# Patient Record
Sex: Male | Born: 1970 | Race: White | Hispanic: No | Marital: Married | Smoking: Never smoker
Health system: Southern US, Community
[De-identification: ages and names within clinical notes are randomized; demographics above are authoritative.]

## PROBLEM LIST (undated history)

## (undated) DIAGNOSIS — Q2381 Bicuspid aortic valve: Secondary | ICD-10-CM

## (undated) DIAGNOSIS — Z789 Other specified health status: Secondary | ICD-10-CM

## (undated) DIAGNOSIS — Q231 Congenital insufficiency of aortic valve: Secondary | ICD-10-CM

## (undated) HISTORY — PX: VASECTOMY: SHX75

## (undated) HISTORY — PX: WISDOM TOOTH EXTRACTION: SHX21

---

## 2011-10-15 ENCOUNTER — Other Ambulatory Visit: Payer: Self-pay

## 2011-10-15 ENCOUNTER — Ambulatory Visit (HOSPITAL_COMMUNITY): Payer: BC Managed Care – PPO

## 2011-10-15 ENCOUNTER — Encounter (HOSPITAL_COMMUNITY): Payer: Self-pay | Admitting: Certified Registered Nurse Anesthetist

## 2011-10-15 ENCOUNTER — Ambulatory Visit (INDEPENDENT_AMBULATORY_CARE_PROVIDER_SITE_OTHER): Payer: BC Managed Care – PPO | Admitting: Emergency Medicine

## 2011-10-15 ENCOUNTER — Encounter (HOSPITAL_BASED_OUTPATIENT_CLINIC_OR_DEPARTMENT_OTHER): Payer: Self-pay | Admitting: *Deleted

## 2011-10-15 ENCOUNTER — Encounter (HOSPITAL_COMMUNITY)
Admission: EM | Disposition: A | Discharge status: Home / Self Care | Payer: Self-pay | Source: Ambulatory Visit | Attending: Orthopedic Surgery

## 2011-10-15 ENCOUNTER — Ambulatory Visit (HOSPITAL_COMMUNITY)
Admission: EM | Admit: 2011-10-15 | Discharge: 2011-10-15 | Disposition: A | Discharge status: Home / Self Care | Payer: BC Managed Care – PPO | Source: Ambulatory Visit | Attending: Orthopedic Surgery | Admitting: Orthopedic Surgery

## 2011-10-15 ENCOUNTER — Ambulatory Visit: Payer: BC Managed Care – PPO

## 2011-10-15 ENCOUNTER — Encounter (HOSPITAL_COMMUNITY): Payer: Self-pay | Admitting: Pharmacy Technician

## 2011-10-15 ENCOUNTER — Ambulatory Visit (HOSPITAL_COMMUNITY): Payer: BC Managed Care – PPO | Admitting: Certified Registered Nurse Anesthetist

## 2011-10-15 VITALS — BP 116/74 | HR 50 | Temp 98.2°F | Resp 14 | Ht 77.0 in | Wt 260.0 lb

## 2011-10-15 DIAGNOSIS — S6990XA Unspecified injury of unspecified wrist, hand and finger(s), initial encounter: Secondary | ICD-10-CM | POA: Insufficient documentation

## 2011-10-15 DIAGNOSIS — W278XXA Contact with other nonpowered hand tool, initial encounter: Secondary | ICD-10-CM | POA: Insufficient documentation

## 2011-10-15 DIAGNOSIS — S61209A Unspecified open wound of unspecified finger without damage to nail, initial encounter: Secondary | ICD-10-CM

## 2011-10-15 DIAGNOSIS — Z01810 Encounter for preprocedural cardiovascular examination: Secondary | ICD-10-CM | POA: Insufficient documentation

## 2011-10-15 DIAGNOSIS — S60459A Superficial foreign body of unspecified finger, initial encounter: Secondary | ICD-10-CM

## 2011-10-15 DIAGNOSIS — Z01818 Encounter for other preprocedural examination: Secondary | ICD-10-CM | POA: Insufficient documentation

## 2011-10-15 DIAGNOSIS — Z01812 Encounter for preprocedural laboratory examination: Secondary | ICD-10-CM | POA: Insufficient documentation

## 2011-10-15 DIAGNOSIS — S6980XA Other specified injuries of unspecified wrist, hand and finger(s), initial encounter: Secondary | ICD-10-CM | POA: Insufficient documentation

## 2011-10-15 HISTORY — DX: Other specified health status: Z78.9

## 2011-10-15 HISTORY — PX: I & D EXTREMITY: SHX5045

## 2011-10-15 HISTORY — DX: Bicuspid aortic valve: Q23.81

## 2011-10-15 HISTORY — DX: Congenital insufficiency of aortic valve: Q23.1

## 2011-10-15 LAB — BASIC METABOLIC PANEL
CO2: 23 mEq/L (ref 19–32)
Calcium: 9.4 mg/dL (ref 8.4–10.5)
Chloride: 103 mEq/L (ref 96–112)
Glucose, Bld: 80 mg/dL (ref 70–99)
Sodium: 138 mEq/L (ref 135–145)

## 2011-10-15 LAB — GRAM STAIN

## 2011-10-15 LAB — CBC
Hemoglobin: 15.8 g/dL (ref 13.0–17.0)
MCH: 31.3 pg (ref 26.0–34.0)
MCV: 87.3 fL (ref 78.0–100.0)
Platelets: 224 10*3/uL (ref 150–400)
RBC: 5.05 MIL/uL (ref 4.22–5.81)
WBC: 9.4 10*3/uL (ref 4.0–10.5)

## 2011-10-15 LAB — SURGICAL PCR SCREEN: MRSA, PCR: NEGATIVE

## 2011-10-15 SURGERY — IRRIGATION AND DEBRIDEMENT EXTREMITY
Anesthesia: General | Site: Finger | Laterality: Right | Wound class: Dirty or Infected

## 2011-10-15 MED ORDER — FENTANYL CITRATE 0.05 MG/ML IJ SOLN
INTRAMUSCULAR | Status: DC | PRN
  Administered 2011-10-15 (×4): 50 ug via INTRAVENOUS

## 2011-10-15 MED ORDER — DEXTROSE 5 % IV SOLN
INTRAVENOUS | Status: DC | PRN
  Administered 2011-10-15: 20:00:00 via INTRAVENOUS

## 2011-10-15 MED ORDER — LACTATED RINGERS IV SOLN
INTRAVENOUS | Status: DC | PRN
  Administered 2011-10-15 (×2): via INTRAVENOUS

## 2011-10-15 MED ORDER — VANCOMYCIN HCL 1000 MG IV SOLR
1000.0000 mg | INTRAVENOUS | Status: DC | PRN
  Administered 2011-10-15: 1000 mg via INTRAVENOUS

## 2011-10-15 MED ORDER — VANCOMYCIN HCL IN DEXTROSE 1-5 GM/200ML-% IV SOLN
INTRAVENOUS | Status: AC
  Filled 2011-10-15: qty 200

## 2011-10-15 MED ORDER — CHLORHEXIDINE GLUCONATE 4 % EX LIQD: 60.0000 mL | Freq: Once | CUTANEOUS | Status: DC

## 2011-10-15 MED ORDER — LIDOCAINE HCL (CARDIAC) 20 MG/ML IV SOLN
INTRAVENOUS | Status: DC | PRN
  Administered 2011-10-15: 60 mg via INTRAVENOUS

## 2011-10-15 MED ORDER — SULFAMETHOXAZOLE-TRIMETHOPRIM 800-160 MG PO TABS: 1.0000 | ORAL_TABLET | Freq: Two times a day (BID) | ORAL | Status: AC

## 2011-10-15 MED ORDER — BUPIVACAINE HCL (PF) 0.25 % IJ SOLN
INTRAMUSCULAR | Status: DC | PRN
  Administered 2011-10-15: 10 mL

## 2011-10-15 MED ORDER — OXYCODONE-ACETAMINOPHEN 5-325 MG PO TABS: ORAL_TABLET | ORAL | Status: AC

## 2011-10-15 MED ORDER — MIDAZOLAM HCL 5 MG/5ML IJ SOLN
INTRAMUSCULAR | Status: DC | PRN
  Administered 2011-10-15: 2 mg via INTRAVENOUS

## 2011-10-15 MED ORDER — SODIUM CHLORIDE 0.9 % IR SOLN
Status: DC | PRN
  Administered 2011-10-15: 3000 mL

## 2011-10-15 MED ORDER — MUPIROCIN 2 % EX OINT
TOPICAL_OINTMENT | Freq: Two times a day (BID) | CUTANEOUS | Status: DC
  Administered 2011-10-15: 1 via NASAL

## 2011-10-15 MED ORDER — HYDROMORPHONE HCL PF 1 MG/ML IJ SOLN: 0.2500 mg | INTRAMUSCULAR | Status: DC | PRN

## 2011-10-15 MED ORDER — PROPOFOL 10 MG/ML IV EMUL
INTRAVENOUS | Status: DC | PRN
  Administered 2011-10-15: 250 mg via INTRAVENOUS

## 2011-10-15 SURGICAL SUPPLY — 58 items
BANDAGE COBAN STERILE 2 (GAUZE/BANDAGES/DRESSINGS) IMPLANT
BANDAGE CONFORM 2  STR LF (GAUZE/BANDAGES/DRESSINGS) IMPLANT
BANDAGE ELASTIC 3 VELCRO ST LF (GAUZE/BANDAGES/DRESSINGS) ×2 IMPLANT
BANDAGE ELASTIC 4 VELCRO ST LF (GAUZE/BANDAGES/DRESSINGS) ×2 IMPLANT
BANDAGE GAUZE ELAST BULKY 4 IN (GAUZE/BANDAGES/DRESSINGS) ×2 IMPLANT
BNDG COHESIVE 1X5 TAN STRL LF (GAUZE/BANDAGES/DRESSINGS) IMPLANT
BNDG ESMARK 4X9 LF (GAUZE/BANDAGES/DRESSINGS) IMPLANT
CLOTH BEACON ORANGE TIMEOUT ST (SAFETY) ×2 IMPLANT
CORDS BIPOLAR (ELECTRODE) ×2 IMPLANT
COVER SURGICAL LIGHT HANDLE (MISCELLANEOUS) ×2 IMPLANT
DECANTER SPIKE VIAL GLASS SM (MISCELLANEOUS) ×2 IMPLANT
DRAIN PENROSE 1/4X12 LTX STRL (WOUND CARE) IMPLANT
DRSG ADAPTIC 3X8 NADH LF (GAUZE/BANDAGES/DRESSINGS) IMPLANT
DRSG EMULSION OIL 3X3 NADH (GAUZE/BANDAGES/DRESSINGS) ×2 IMPLANT
DRSG PAD ABDOMINAL 8X10 ST (GAUZE/BANDAGES/DRESSINGS) ×4 IMPLANT
GAUZE PACKING IODOFORM 1/4X5 (PACKING) ×2 IMPLANT
GAUZE XEROFORM 1X8 LF (GAUZE/BANDAGES/DRESSINGS) ×2 IMPLANT
GLOVE BIO SURGEON STRL SZ 6.5 (GLOVE) ×2 IMPLANT
GLOVE BIO SURGEON STRL SZ7.5 (GLOVE) ×2 IMPLANT
GLOVE BIOGEL PI IND STRL 6.5 (GLOVE) ×1 IMPLANT
GLOVE BIOGEL PI IND STRL 8 (GLOVE) ×1 IMPLANT
GLOVE BIOGEL PI INDICATOR 6.5 (GLOVE) ×1
GLOVE BIOGEL PI INDICATOR 8 (GLOVE) ×1
GOWN STRL REIN XL XLG (GOWN DISPOSABLE) ×2 IMPLANT
HANDPIECE INTERPULSE COAX TIP (DISPOSABLE)
HIBICLENS 32OZ XXX (MISCELLANEOUS) ×2 IMPLANT
KIT BASIN OR (CUSTOM PROCEDURE TRAY) ×2 IMPLANT
KIT ROOM TURNOVER OR (KITS) ×2 IMPLANT
LOOP VESSEL MAXI BLUE (MISCELLANEOUS) IMPLANT
LOOP VESSEL MINI RED (MISCELLANEOUS) IMPLANT
MANIFOLD NEPTUNE II (INSTRUMENTS) ×2 IMPLANT
NEEDLE HYPO 25X1 1.5 SAFETY (NEEDLE) ×2 IMPLANT
NS IRRIG 1000ML POUR BTL (IV SOLUTION) ×2 IMPLANT
PACK ORTHO EXTREMITY (CUSTOM PROCEDURE TRAY) ×2 IMPLANT
PAD ARMBOARD 7.5X6 YLW CONV (MISCELLANEOUS) ×4 IMPLANT
PAD CAST 4YDX4 CTTN HI CHSV (CAST SUPPLIES) ×1 IMPLANT
PADDING CAST COTTON 4X4 STRL (CAST SUPPLIES) ×1
SCRUB BETADINE 4OZ XXX (MISCELLANEOUS) IMPLANT
SET HNDPC FAN SPRY TIP SCT (DISPOSABLE) IMPLANT
SOLUTION BETADINE 4OZ (MISCELLANEOUS) IMPLANT
SPLINT PLASTER EXTRA FAST 3X15 (CAST SUPPLIES) ×1
SPLINT PLASTER GYPS XFAST 3X15 (CAST SUPPLIES) ×1 IMPLANT
SPONGE GAUZE 4X4 12PLY (GAUZE/BANDAGES/DRESSINGS) ×2 IMPLANT
SPONGE LAP 18X18 X RAY DECT (DISPOSABLE) ×2 IMPLANT
SPONGE LAP 4X18 X RAY DECT (DISPOSABLE) ×2 IMPLANT
SUCTION FRAZIER TIP 10 FR DISP (SUCTIONS) ×2 IMPLANT
SUT ETHILON 4 0 PS 2 18 (SUTURE) ×2 IMPLANT
SUT MON AB 5-0 P3 18 (SUTURE) IMPLANT
SYR 30ML LL (SYRINGE) ×2 IMPLANT
SYR CONTROL 10ML LL (SYRINGE) ×2 IMPLANT
TOWEL OR 17X24 6PK STRL BLUE (TOWEL DISPOSABLE) ×2 IMPLANT
TOWEL OR 17X26 10 PK STRL BLUE (TOWEL DISPOSABLE) ×2 IMPLANT
TUBE ANAEROBIC SPECIMEN COL (MISCELLANEOUS) ×4 IMPLANT
TUBE CONNECTING 12X1/4 (SUCTIONS) ×2 IMPLANT
TUBE FEEDING 5FR 15 INCH (TUBING) ×2 IMPLANT
UNDERPAD 30X30 INCONTINENT (UNDERPADS AND DIAPERS) ×2 IMPLANT
WATER STERILE IRR 1000ML POUR (IV SOLUTION) ×2 IMPLANT
YANKAUER SUCT BULB TIP NO VENT (SUCTIONS) ×2 IMPLANT

## 2011-10-15 NOTE — Transfer of Care (Signed)
Immediate Anesthesia Transfer of Care Note  Patient: Theodore Myers  Procedure(s) Performed: Procedure(s) (LRB): IRRIGATION AND DEBRIDEMENT EXTREMITY (Right)  Patient Location: PACU  Anesthesia Type: General  Level of Consciousness: awake  Airway & Oxygen Therapy: Patient Spontanous Breathing and Patient connected to nasal cannula oxygen  Post-op Assessment: Report given to PACU RN and Post -op Vital signs reviewed and stable  Post vital signs: Reviewed and stable  Complications: No apparent anesthesia complications

## 2011-10-15 NOTE — Progress Notes (Signed)
  Subjective:    Patient ID: Theodore Myers, male    DOB: 06-01-1971, 41 y.o.   MRN: [id number]  HPI patient states that yesterday he was using a paint sprayer and injected TM pink medication into his middle finger. He now has pain swelling and redness at the injection site    Review of Systems no other complaints except for the right middle finger.     Objective:   Physical Exam there is a puncture site over the flexor surface of the proximal phalanx of his right middle finger. Pain with flexion of the finger against resistance. There is no swelling at the PIP or the MCP joint.        Assessment & Plan:  Patient has a velocity injection injury to the right middle phalanx . there's evidence of secondary inflammation infection at the puncture site.

## 2011-10-15 NOTE — H&P (Signed)
Theodore Myers is an 41 y.o. male.   Chief Complaint: paint injection HPI: 41 yo rhd male injected right long finger with paint sprayer 24 hours ago. Swelling, erythema, pain since. Seen at [facility] and referred for care. No fevers, chills, night sweats.   Past Medical History  Diagnosis Date  . No pertinent past medical history   . Bicuspid aortic valve     not undergoing any treatment at this time    Past Surgical History  Procedure Date  . Vasectomy   . Wisdom tooth extraction     No family history on file. Social History:  reports that he has never smoked. He does not have any smokeless tobacco history on file. He reports that he drinks about 2.4 ounces of alcohol per week. His drug history not on file.  Allergies:  Allergies  Allergen Reactions  . Penicillins Hives  . Iodine Rash    Medications Prior to Admission  Medication Dose Route Frequency Provider Last Rate Last Dose  . chlorhexidine (HIBICLENS) 4 % liquid 4 application  60 mL Topical Once Tami Ribas, MD       No current outpatient prescriptions on file as of 10/15/2011.    Results for orders placed during the hospital encounter of 10/15/11 (from the past 48 hour(s))  CBC     Status: Normal   Collection Time   10/15/11  4:43 PM      Component Value Range Comment   WBC 9.4  4.0 - 10.5 (K/uL)    RBC 5.05  4.22 - 5.81 (MIL/uL)    Hemoglobin 15.8  13.0 - 17.0 (g/dL)    HCT 16.1  09.6 - 04.5 (%)    MCV 87.3  78.0 - 100.0 (fL)    MCH 31.3  26.0 - 34.0 (pg)    MCHC 35.8  30.0 - 36.0 (g/dL)    RDW 40.9  81.1 - 91.4 (%)    Platelets 224  150 - 400 (K/uL)   BASIC METABOLIC PANEL     Status: Normal   Collection Time   10/15/11  4:43 PM      Component Value Range Comment   Sodium 138  135 - 145 (mEq/L)    Potassium 3.7  3.5 - 5.1 (mEq/L)    Chloride 103  96 - 112 (mEq/L)    CO2 23  19 - 32 (mEq/L)    Glucose, Bld 80  70 - 99 (mg/dL)    BUN 12  6 - 23 (mg/dL)    Creatinine, Ser 7.82  0.50 - 1.35 (mg/dL)     Calcium 9.4  8.4 - 10.5 (mg/dL)    GFR calc non Af Amer >90  >90 (mL/min)    GFR calc Af Amer >90  >90 (mL/min)     Dg Chest 2 View  10/15/2011  *RADIOLOGY REPORT*  Clinical Data: Preop for finger surgery.  CHEST - 2 VIEW  Comparison: None.  Findings: Midline trachea.  Patient rotated minimally left. Normal heart size and mediastinal contours. No pleural effusion or pneumothorax.  Clear lungs.  IMPRESSION: No acute cardiopulmonary disease.  Original Report Authenticated By: Consuello Bossier, M.D.   Dg Finger Middle Right  10/15/2011  [facility] BY [facility] *RADIOLOGY REPORT*  Clinical Data: Wound finger  RIGHT MIDDLE FINGER 2+V  Comparison: Preliminary reading of Dr. Cleta Alberts  Findings: Three views of the right third finger submitted.  No acute fracture or subluxation.  Faint high density material noted within soft tissue adjacent  to proximal phalanx suspicious for foreign body.  Clinical correlation is necessary.  Measures about 4 mm.  IMPRESSION: No acute fracture or subluxation.  Faint high density material noted within soft tissue adjacent to proximal phalanx suspicious for foreign body.  Clinical correlation is necessary.  Measures about 4 mm.  Original Report Authenticated By: Natasha Mead, M.D.     A comprehensive review of systems was negative.  Blood pressure 129/86, pulse 59, temperature 97.3 F (36.3 C), temperature source Oral, resp. rate 16, height 6\' 4"  (1.93 m), weight 115.667 kg (255 lb), SpO2 100.00%.  General appearance: alert, cooperative and appears stated age Head: Normocephalic, without obvious abnormality, atraumatic Neck: supple, symmetrical, trachea midline Resp: clear to auscultation bilaterally Cardio: regular rate and rhythm GI: soft, non-tender; bowel sounds normal; no masses,  no organomegaly Extremities: light touch sensation and capillary refill intact all digits.  +epl/fpl/io.  right long with small wound volar proximal phalanx.  swelling, erythema  surrounding.  no ttp middle phalanx or palm or over mp.  rom long finger limited by swelling.  no pain with motion of finger. Pulses: 2+ and symmetric Skin: as above Neurologic: Grossly normal Incision/Wound: As above  Assessment/Plan Right long finger paint injection injury. Recommend I&D in OR. Discussed potential need for extended I&D of finger, hand, or forearm if paint tracks far from wound. Risks, benefits, and alternatives of surgery were discussed and the patient agrees with the plan of care.   Guila Owensby R 10/15/2011, 6:11 PM

## 2011-10-15 NOTE — Patient Instructions (Signed)
You have an appointment at the hand Center at 1:30. Do not eat or drink any medication. He will be seen at the hand Center on [facility]

## 2011-10-15 NOTE — Anesthesia Procedure Notes (Signed)
Procedure Name: LMA Insertion Date/Time: 10/15/2011 5:40 PM Performed by: Julianne Rice Z Pre-anesthesia Checklist: Patient identified, Timeout performed, Emergency Drugs available, Suction available and Patient being monitored Patient Re-evaluated:Patient Re-evaluated prior to inductionOxygen Delivery Method: Circle system utilized Preoxygenation: Pre-oxygenation with 100% oxygen Intubation Type: IV induction Ventilation: Mask ventilation without difficulty LMA: LMA inserted LMA Size: 5.0 Number of attempts: 1 Placement Confirmation: breath sounds checked- equal and bilateral and positive ETCO2 Tube secured with: Tape Dental Injury: Teeth and Oropharynx as per pre-operative assessment

## 2011-10-15 NOTE — Progress Notes (Signed)
  Subjective:    Patient ID: Theodore Myers, male    DOB: [birth year], 41 y.o.   MRN: [id number]  HPI    Review of Systems     Objective:   Physical Exam  [facility] reading (PRIMARY) by  Dr.Carmin Alvidrez. X-ray shows foreign material in the soft tissues of the finger over the proximal phalanx       Assessment & Plan:

## 2011-10-15 NOTE — Addendum Note (Signed)
Addendum  created 10/15/11 2233 by Pricilla Holm, CRNA   Modules edited:Anesthesia Blocks and Procedures, Anesthesia Medication Administration, Inpatient Notes

## 2011-10-15 NOTE — Brief Op Note (Signed)
10/15/2011  8:55 PM  PATIENT:  Barbette Hair  41 y.o. male  PRE-OPERATIVE DIAGNOSIS:  paint gun  POST-OPERATIVE DIAGNOSIS:  Infected right middle finger due to paint gun injury  PROCEDURE:  Procedure(s) (LRB): IRRIGATION AND DEBRIDEMENT EXTREMITY (Right)  SURGEON:  Surgeon(s) and Role:    * Tami Ribas, MD - Primary  PHYSICIAN ASSISTANT:   ASSISTANTS: none   ANESTHESIA:   general  EBL:  Total I/O In: 1100 [I.V.:1100] Out: -   BLOOD ADMINISTERED:none  DRAINS: iodoform packing  LOCAL MEDICATIONS USED:  MARCAINE     SPECIMEN:  Source of Specimen:  right long finger  DISPOSITION OF SPECIMEN:  micro  COUNTS:  YES  TOURNIQUET:   Total Tourniquet Time Documented: Upper Arm (Right) - 51 minutes  DICTATION: .Other Dictation: Dictation Number [id number]  PLAN OF CARE: Discharge to home after PACU  PATIENT DISPOSITION:  PACU - hemodynamically stable.

## 2011-10-15 NOTE — Discharge Instructions (Signed)

## 2011-10-15 NOTE — Op Note (Signed)
Dictation [id number]

## 2011-10-15 NOTE — Addendum Note (Signed)
Addendum  created 10/15/11 2233 by Jacquelin Krajewski Z Danilynn Jemison, CRNA   Modules edited:Anesthesia Blocks and Procedures, Anesthesia Medication Administration, Inpatient Notes    

## 2011-10-15 NOTE — Anesthesia Postprocedure Evaluation (Signed)
  Anesthesia Post-op Note  Patient: Theodore Myers  Procedure(s) Performed: Procedure(s) (LRB): IRRIGATION AND DEBRIDEMENT EXTREMITY (Right)  Patient Location: PACU  Anesthesia Type: General  Level of Consciousness: awake and alert   Airway and Oxygen Therapy: Patient Spontanous Breathing and Patient connected to nasal cannula oxygen  Post-op Pain: mild  Post-op Assessment: Post-op Vital signs reviewed, Patient's Cardiovascular Status Stable, Respiratory Function Stable, Patent Airway and No signs of Nausea or vomiting  Post-op Vital Signs: Reviewed and stable  Complications: No apparent anesthesia complications

## 2011-10-15 NOTE — Anesthesia Preprocedure Evaluation (Addendum)
Anesthesia Evaluation  Patient identified by MRN, date of birth, ID band Patient awake    Reviewed: Allergy & Precautions, H&P , NPO status , Patient's Chart, lab work & pertinent test results  Airway Mallampati: II TM Distance: >3 FB Neck ROM: Full    Dental No notable dental hx. (+) Teeth Intact   Pulmonary neg pulmonary ROS,  breath sounds clear to auscultation  Pulmonary exam normal       Cardiovascular negative cardio ROS  Rhythm:Regular Rate:Normal     Neuro/Psych negative neurological ROS  negative psych ROS   GI/Hepatic negative GI ROS, Neg liver ROS,   Endo/Other  negative endocrine ROS  Renal/GU negative Renal ROS  negative genitourinary   Musculoskeletal   Abdominal   Peds  Hematology negative hematology ROS (+)   Anesthesia Other Findings   Reproductive/Obstetrics negative OB ROS                           Anesthesia Physical Anesthesia Plan  ASA: I  Anesthesia Plan: General   Post-op Pain Management:    Induction: Intravenous  Airway Management Planned: LMA  Additional Equipment:   Intra-op Plan:   Post-operative Plan: Extubation in OR  Informed Consent: I have reviewed the patients History and Physical, chart, labs and discussed the procedure including the risks, benefits and alternatives for the proposed anesthesia with the patient or authorized representative who has indicated his/her understanding and acceptance.     Plan Discussed with: CRNA  Anesthesia Plan Comments:         Anesthesia Quick Evaluation  

## 2011-10-16 NOTE — Op Note (Signed)
Theodore, Myers NO.:  [id number]  MEDICAL RECORD NO.:  [id number]  LOCATION:  [facility]                         FACILITY:  [facility]  PHYSICIAN:  Betha Loa, MD        DATE OF BIRTH:  [birth year]  DATE OF PROCEDURE:  10/15/2011 DATE OF DISCHARGE:  10/15/2011                              OPERATIVE REPORT   PREOPERATIVE DIAGNOSIS:  Injection injury, right long finger.  POSTOPERATIVE DIAGNOSIS:  Injection injury, right long finger.  PROCEDURE:  Irrigation and debridement of right long finger  injection injury and flexor tendon sheath.  SURGEON:  Betha Loa, MD  ASSISTANT:  None.  ANESTHESIA:  General.  IV FLUIDS:  Per anesthesia flow sheet.  ESTIMATED BLOOD LOSS:  Minimal.  COMPLICATIONS:  None.  SPECIMENS:  Cultures from right long finger.  TOURNIQUET TIME:  51 minutes.  DISPOSITION:  Stable to PACU.  INDICATIONS:  Theodore Myers is a 41 year old right-hand-dominant male who yesterday accidentally discharged a paint sprayer into his right long finger.  He states this was a short spray and was approximately 10 inches away.  He began to have swelling, erythema and pain in the right long finger today.  He was referred to me for care.  On evaluation, he had a swelling at the volar aspect of the proximal phalanx of the right long finger.  There was a small wound.  There was no proximal streaking. He was able to wiggle the finger without significant pain.  It was limited by the swelling in the proximal phalanx.  He was not tender over the middle or distal phalanges and not tender over the MP joint or in the palm of the hand.  I recommended to Mr. Nies going to the operating room for irrigation and debridement.  Risks, benefits and alternatives of surgery were discussed including risk of blood loss, infection, damage to nerves, vessels, tendons, ligaments, bone, failure of surgery, need for additional surgery, complications with wound healing, continued  pain, need for repeat irrigation and debridement, continued infection.  He voiced understanding of these risks and elected to proceed.  OPERATIVE COURSE:  After being identified preoperatively by myself, the patient and I agreed upon the procedure and site of procedure.  The surgical site was marked.  The risks, benefits, and alternatives of surgery were reviewed and he wished to proceed.  Surgical consent had been signed.  He was transferred to the operating room and placed on the operating room table in supine position with the right upper extremity on an armboard.  General esthesia was induced by the anesthesiologist. The right upper extremity was prepped and draped in normal sterile orthopedic fashion.  Surgical pause was performed between the surgeons, anesthesia, and operating room staff, and all were in agreement as to the patient, procedure, and site of procedure.  Tourniquet at the proximal aspect of the extremity was inflated to 250 mmHg after exsanguination of the hand and forearm with the Esmarch bandage.  A Brunner-type incision was made over the proximal phalanx.  This was carried into subcutaneous tissues.  There was paint visible in the subcutaneous tissues just surrounding the wound.  There was some fatty necrosis and inflammatory response.  The pain was entirely debrided. The skin edges were excised.  Any necrotic fat was removed.  The paint did not course into the flexor tendon sheath and did not seem to course proximally or distally.  The incision was extended slightly proximally to check this.  The wound was copiously irrigated with a 1000 mL of sterile saline.  The flexor tendon sheath was then opened distal to the A2 pulley.  There was inflammatory fluid within it.  This was not purulence.  It was clear yellowish.  It was felt that it would be appropriate to irrigate the sheath.  A #5 pediatric feeding tube was placed in the sheath distally.  200 mL of sterile  saline were irrigated by syringe through the feeding tube.  There was good effluent. The feeding tube was then placed proximally.  Again, another 200 mL was irrigated through the sheath in this direction.  There was good effluent as well.  There was no gross purulence.  All necrotic tissue had been debrided.  A single 4-0 nylon stitch was placed in the corner of the incision to keep it from retracting.  The wound was packed open with 0.25-inch iodoform gauze.  It was dressed with sterile Xeroform and 4x4s.  A digital block was performed with 10 mL of 0.25% plain Marcaine to aid in postoperative analgesia.  The wound was dressed with sterile 4x4s and wrapped with a Kerlix bandage.  A volar splint was placed including the index long and ring fingers for the patient comfort.  This was wrapped with Kerlix and Ace bandage.  Tourniquet was deflated at 51 minutes.  The fingertips were pink with brisk capillary refill after deflation of tourniquet.  Operative drapes were broken down.  The patient was awakened from anesthesia safely.  He was transferred back to the stretcher and taken to PACU in stable condition.  I will see him back in the office in 4 days for postoperative followup and initiation of whirlpool therapy.  We will give him Percocet 5/325 1-2 p.o. q.6 hours p.r.n. pain, dispensed #40, and Bactrim DS 1 p.o. b.i.d. x7 days.     Betha Loa, MD     KK/MEDQ  D:  10/15/2011  T:  10/16/2011  Job:  [id number]

## 2011-10-19 LAB — CULTURE, ROUTINE-ABSCESS

## 2011-10-20 ENCOUNTER — Encounter (HOSPITAL_COMMUNITY): Payer: Self-pay | Admitting: Orthopedic Surgery

## 2012-12-07 IMAGING — CR DG CHEST 2V
2 series · 2 of 2 positions shown · non-contrast
Comparison: None.

CLINICAL DATA: Preop for finger surgery.

CHEST - 2 VIEW

[view not recorded (1 of 2)]
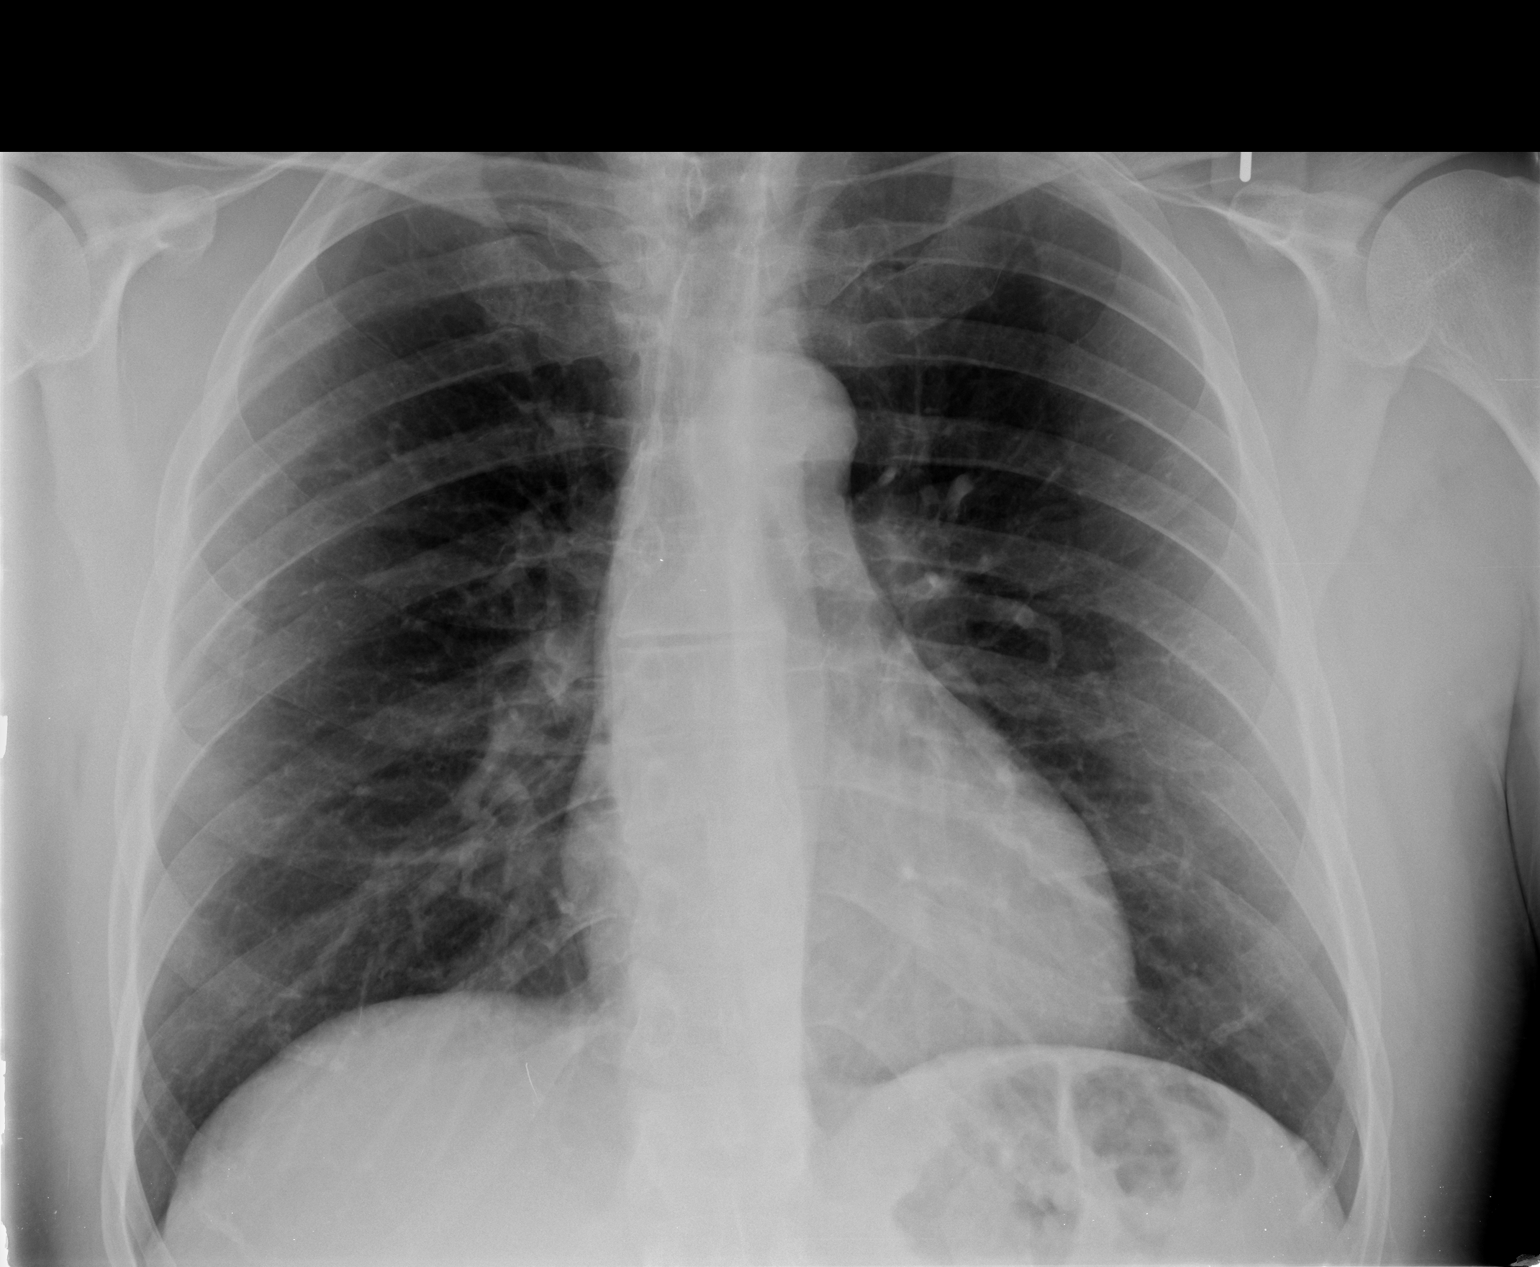

[view not recorded (2 of 2)]
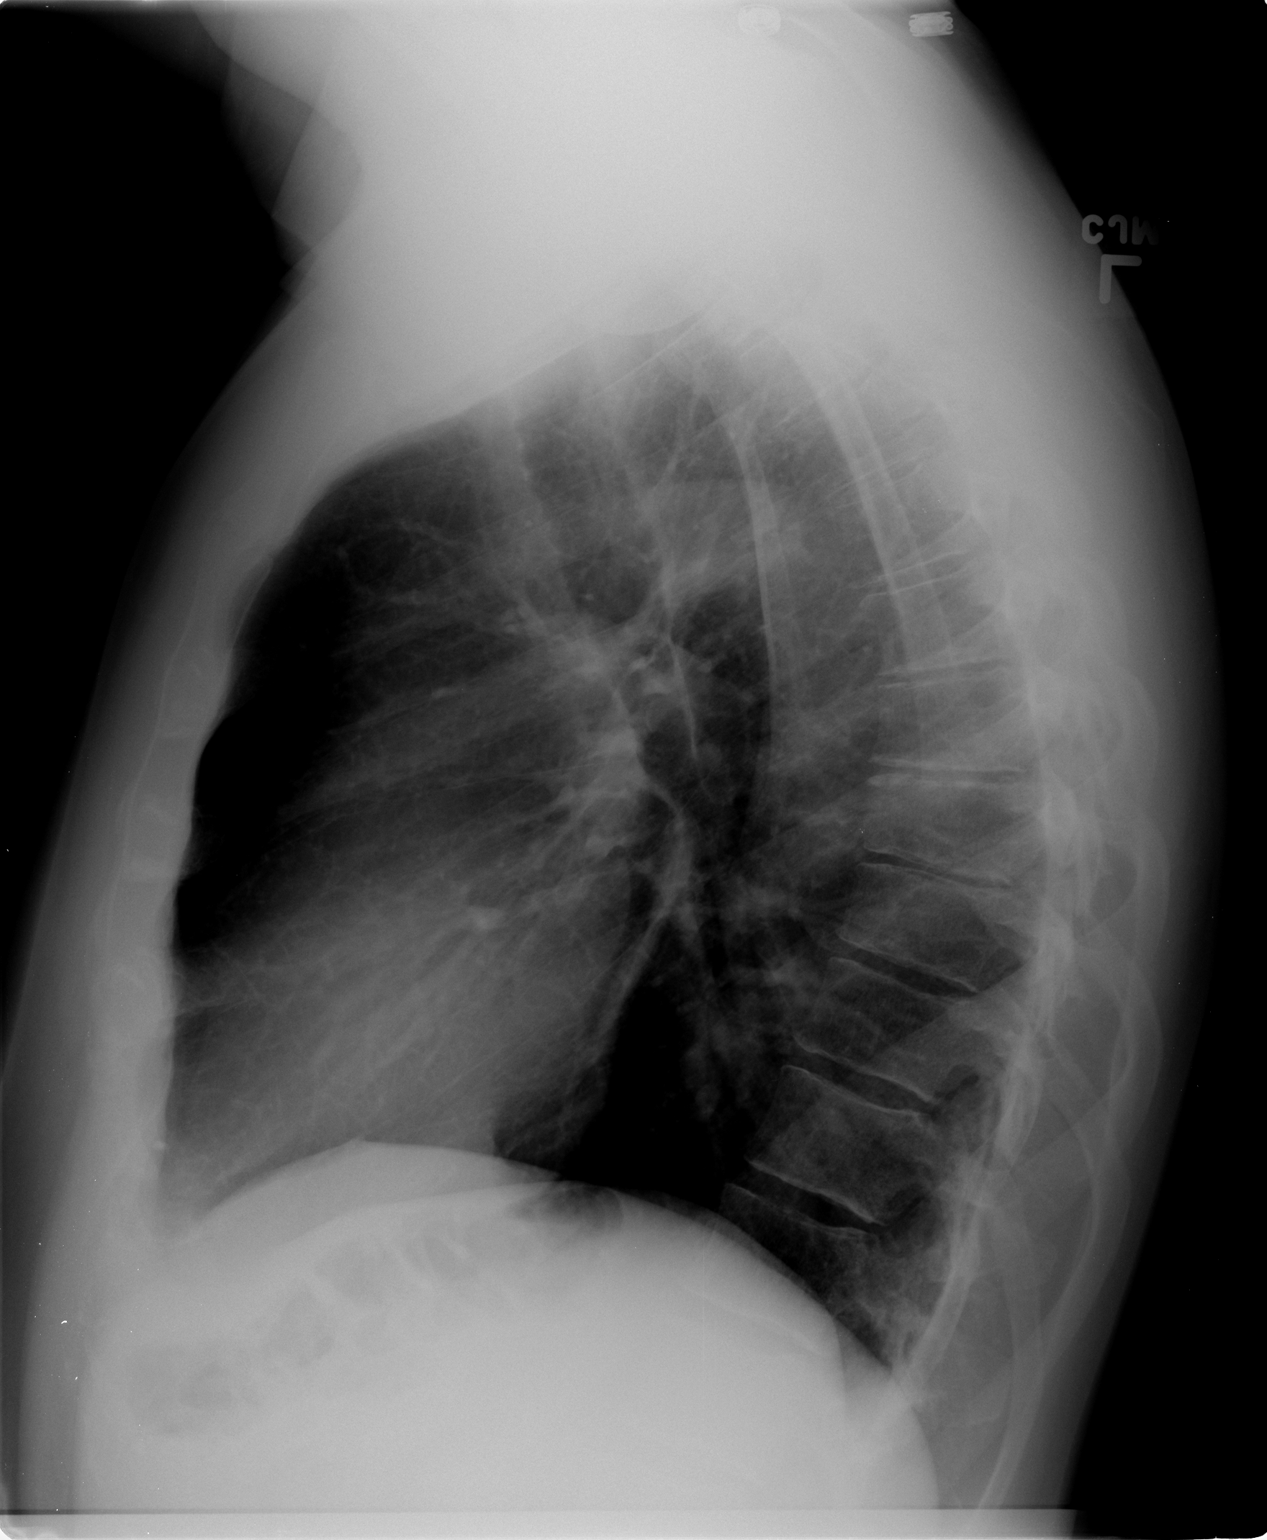

[2 of 2 positions shown; findings below may reference images not displayed]

FINDINGS: Midline trachea.  Patient rotated minimally left. Normal
heart size and mediastinal contours. No pleural effusion or
pneumothorax.  Clear lungs.
IMPRESSION: No acute cardiopulmonary disease.

## 2015-03-14 ENCOUNTER — Ambulatory Visit: Payer: Self-pay

## 2015-03-14 ENCOUNTER — Encounter: Payer: Self-pay | Admitting: Podiatry

## 2015-03-14 ENCOUNTER — Ambulatory Visit: Payer: 59

## 2015-03-14 ENCOUNTER — Ambulatory Visit (INDEPENDENT_AMBULATORY_CARE_PROVIDER_SITE_OTHER): Payer: 59 | Admitting: Podiatry

## 2015-03-14 ENCOUNTER — Ambulatory Visit (INDEPENDENT_AMBULATORY_CARE_PROVIDER_SITE_OTHER): Payer: 59

## 2015-03-14 VITALS — BP 124/77 | HR 75 | Resp 18

## 2015-03-14 DIAGNOSIS — M79673 Pain in unspecified foot: Secondary | ICD-10-CM | POA: Diagnosis not present

## 2015-03-14 DIAGNOSIS — M722 Plantar fascial fibromatosis: Secondary | ICD-10-CM | POA: Diagnosis not present

## 2015-03-14 MED ORDER — TRIAMCINOLONE ACETONIDE 10 MG/ML IJ SUSP
10.0000 mg | Freq: Once | INTRAMUSCULAR | Status: AC
  Administered 2015-03-14: 10 mg

## 2015-03-14 MED ORDER — DICLOFENAC SODIUM 75 MG PO TBEC: 75.0000 mg | DELAYED_RELEASE_TABLET | Freq: Two times a day (BID) | ORAL | Status: AC

## 2015-03-14 NOTE — Progress Notes (Signed)
Subjective:     Patient ID: Theodore Myers, male   DOB: 22-Feb-1971, 44 y.o.   MRN: [id number]  HPI patient states he has approximately 9 month history of pain in his left heel that has worsened recently and has been bothersome for the entire.. Has tried to change shoe gear and reduced activity   Review of Systems  All other systems reviewed and are negative.      Objective:   Physical Exam  Constitutional: He is oriented to person, place, and time.  Cardiovascular: Intact distal pulses.   Musculoskeletal: Normal range of motion.  Neurological: He is oriented to person, place, and time.  Skin: Skin is warm.  Nursing note and vitals reviewed.  neurovascular status intact muscle strength adequate range of motion within normal limits. Patient's noted to have good digital perfusion is well oriented 3 with mild equinus condition and is noted to have exquisite discomfort left plantar fascia at the insertion of the tendon into the calcaneus. Moderate depression of the arch is noted upon weightbearing     Assessment:     Plantar fasciitis acute nature left with chronic element to condition    Plan:     H&P and x-rays reviewed. Today I went ahead and I injected the left plantar fascia 3 mg Kenalog 5 mg Xylocaine and applied fascial brace with instructions on usage. Begin home physical therapy which I gave him instructions for and reappoint to recheck again in 1 week

## 2015-03-14 NOTE — Progress Notes (Signed)
   Subjective:    Patient ID: Theodore Myers, male    DOB: [birth year], 44 y.o.   MRN: [id number]  HPI i have some left heel pain and has been going on since thanksgiving 2015 and pain like a horse shoe and worse in am and hurts when i get up and 2 weeks ago was walking weird and i have inserts that is old and not working    Review of Systems  Musculoskeletal:       Joint pain  All other systems reviewed and are negative.      Objective:   Physical Exam        Assessment & Plan:

## 2015-03-14 NOTE — Patient Instructions (Signed)

## 2015-03-21 ENCOUNTER — Encounter: Payer: Self-pay | Admitting: Podiatry

## 2015-03-21 ENCOUNTER — Ambulatory Visit (INDEPENDENT_AMBULATORY_CARE_PROVIDER_SITE_OTHER): Payer: 59 | Admitting: Podiatry

## 2015-03-21 VITALS — BP 138/84 | HR 64 | Resp 16

## 2015-03-21 DIAGNOSIS — M722 Plantar fascial fibromatosis: Secondary | ICD-10-CM

## 2015-03-21 NOTE — Progress Notes (Signed)
Subjective:     Patient ID: Theodore Myers, male   DOB: [birth year], 44 y.o.   MRN: [id number]  HPI patient presents stating that his heel is feeling approximately 85% better but he knows she's had this problem for about 9 months   Review of Systems     Objective:   Physical Exam Neurovascular status intact with no change in health history and noted to have discomfort in the plantar fascia of a minimal nature with reduction of edema inflammation around the insertion into the calcaneus. Noted to have a flat foot structure upon evaluation    Assessment:     Plantar fasciitis left with inflammation and fluid around the medial band that has improved but long-term chronic structural issues    Plan:     H&P and condition reviewed with patient. Today I went ahead and I advised on physical therapy and I inflammatory is continued brace usage and I scanned for custom orthotics. Spent a great of time educating him on the long-term nature condition and treatments

## 2015-04-12 ENCOUNTER — Ambulatory Visit: Payer: 59 | Admitting: *Deleted

## 2015-04-12 DIAGNOSIS — M722 Plantar fascial fibromatosis: Secondary | ICD-10-CM

## 2015-04-12 NOTE — Patient Instructions (Signed)

## 2015-04-12 NOTE — Progress Notes (Signed)
Patient ID: Theodore Myers, male   DOB: [birth year], 44 y.o.   MRN: [id number] Patient presents for orthotic pick up.  Verbal and written break in and wear instructions given.  Patient will follow up in 4 weeks if symptoms worsen or fail to improve.
